# Patient Record
Sex: Male | Born: 1950 | Hispanic: No | Marital: Single
Health system: Southern US, Community
[De-identification: ages and names within clinical notes are randomized; demographics above are authoritative.]

---

## 2016-08-10 ENCOUNTER — Emergency Department (HOSPITAL_COMMUNITY): Payer: Self-pay

## 2016-08-10 ENCOUNTER — Encounter (HOSPITAL_COMMUNITY): Payer: Self-pay | Admitting: Emergency Medicine

## 2016-08-10 ENCOUNTER — Emergency Department (HOSPITAL_COMMUNITY)
Admission: EM | Admit: 2016-08-10 | Discharge: 2016-08-10 | Disposition: A | Discharge status: Home / Self Care | Payer: Self-pay | Attending: Emergency Medicine | Admitting: Emergency Medicine

## 2016-08-10 DIAGNOSIS — N132 Hydronephrosis with renal and ureteral calculous obstruction: Secondary | ICD-10-CM

## 2016-08-10 DIAGNOSIS — N1330 Unspecified hydronephrosis: Secondary | ICD-10-CM | POA: Insufficient documentation

## 2016-08-10 DIAGNOSIS — N201 Calculus of ureter: Secondary | ICD-10-CM | POA: Insufficient documentation

## 2016-08-10 LAB — COMPREHENSIVE METABOLIC PANEL
ALT: 17 U/L (ref 17–63)
ANION GAP: 10 (ref 5–15)
AST: 25 U/L (ref 15–41)
Albumin: 4.1 g/dL (ref 3.5–5.0)
Alkaline Phosphatase: 68 U/L (ref 38–126)
BUN: 6 mg/dL (ref 6–20)
CHLORIDE: 106 mmol/L (ref 101–111)
CO2: 22 mmol/L (ref 22–32)
CREATININE: 0.96 mg/dL (ref 0.61–1.24)
Calcium: 9.2 mg/dL (ref 8.9–10.3)
Glucose, Bld: 121 mg/dL — ABNORMAL HIGH (ref 65–99)
POTASSIUM: 4 mmol/L (ref 3.5–5.1)
SODIUM: 138 mmol/L (ref 135–145)
Total Bilirubin: 1.2 mg/dL (ref 0.3–1.2)
Total Protein: 6.4 g/dL — ABNORMAL LOW (ref 6.5–8.1)

## 2016-08-10 LAB — URINE MICROSCOPIC-ADD ON: Squamous Epithelial / LPF: NONE SEEN

## 2016-08-10 LAB — URINALYSIS, ROUTINE W REFLEX MICROSCOPIC
Bilirubin Urine: NEGATIVE
Glucose, UA: NEGATIVE mg/dL
Ketones, ur: NEGATIVE mg/dL
Leukocytes, UA: NEGATIVE
NITRITE: NEGATIVE
PROTEIN: NEGATIVE mg/dL
Specific Gravity, Urine: 1.015 (ref 1.005–1.030)
pH: 5 (ref 5.0–8.0)

## 2016-08-10 LAB — CBC WITH DIFFERENTIAL/PLATELET
Basophils Absolute: 0 10*3/uL (ref 0.0–0.1)
Basophils Relative: 0 %
EOS ABS: 0.2 10*3/uL (ref 0.0–0.7)
EOS PCT: 2 %
HCT: 44.5 % (ref 39.0–52.0)
Hemoglobin: 15.8 g/dL (ref 13.0–17.0)
LYMPHS ABS: 1.9 10*3/uL (ref 0.7–4.0)
LYMPHS PCT: 26 %
MCH: 31.2 pg (ref 26.0–34.0)
MCHC: 35.5 g/dL (ref 30.0–36.0)
MCV: 87.8 fL (ref 78.0–100.0)
MONO ABS: 0.4 10*3/uL (ref 0.1–1.0)
MONOS PCT: 5 %
Neutro Abs: 5 10*3/uL (ref 1.7–7.7)
Neutrophils Relative %: 67 %
PLATELETS: 145 10*3/uL — AB (ref 150–400)
RBC: 5.07 MIL/uL (ref 4.22–5.81)
RDW: 13 % (ref 11.5–15.5)
WBC: 7.4 10*3/uL (ref 4.0–10.5)

## 2016-08-10 LAB — I-STAT TROPONIN, ED: TROPONIN I, POC: 0 ng/mL (ref 0.00–0.08)

## 2016-08-10 LAB — LIPASE, BLOOD: LIPASE: 39 U/L (ref 11–51)

## 2016-08-10 MED ORDER — ONDANSETRON HCL 4 MG/2ML IJ SOLN
4.0000 mg | Freq: Once | INTRAMUSCULAR | Status: AC
  Administered 2016-08-10: 4 mg via INTRAVENOUS
  Filled 2016-08-10: qty 2

## 2016-08-10 MED ORDER — TAMSULOSIN HCL 0.4 MG PO CAPS: 0.4000 mg | ORAL_CAPSULE | Freq: Two times a day (BID) | ORAL | 0 refills | Status: AC

## 2016-08-10 MED ORDER — HYDROCODONE-ACETAMINOPHEN 5-325 MG PO TABS
1.0000 | ORAL_TABLET | Freq: Once | ORAL | Status: AC
  Administered 2016-08-10: 1 via ORAL
  Filled 2016-08-10: qty 1

## 2016-08-10 MED ORDER — ONDANSETRON HCL 4 MG PO TABS: 4.0000 mg | ORAL_TABLET | Freq: Three times a day (TID) | ORAL | 0 refills | Status: AC | PRN

## 2016-08-10 MED ORDER — KETOROLAC TROMETHAMINE 60 MG/2ML IM SOLN
30.0000 mg | Freq: Once | INTRAMUSCULAR | Status: AC
  Administered 2016-08-10: 30 mg via INTRAMUSCULAR
  Filled 2016-08-10: qty 2

## 2016-08-10 MED ORDER — TAMSULOSIN HCL 0.4 MG PO CAPS
0.8000 mg | ORAL_CAPSULE | Freq: Once | ORAL | Status: AC
  Administered 2016-08-10: 0.8 mg via ORAL
  Filled 2016-08-10: qty 2

## 2016-08-10 MED ORDER — HYDROCODONE-ACETAMINOPHEN 5-325 MG PO TABS: 1.0000 | ORAL_TABLET | Freq: Four times a day (QID) | ORAL | 0 refills | Status: AC | PRN

## 2016-08-10 NOTE — ED Triage Notes (Signed)
Pt speaks [place] does not speak any english and has lower L quadrant abdominal pain woke up with it this morning. Denies N/V/D. Pt feels like he cannot pee and it burns when he urinates.

## 2016-08-10 NOTE — ED Notes (Signed)
Family at the bedside.

## 2016-08-10 NOTE — ED Notes (Signed)
Pt does not speak any english called interpreter 4 times to communicate, all interpreters were not able to speak pt's language.

## 2016-08-10 NOTE — ED Notes (Signed)
[phone] SAYTKZSWFBhayabhai, Brother of patient.  On way to hospital but will not be here until later this evening.  Another brother already admitted on another floor.  Norton BlizzardBhayabhai does speak English and patient has agreed to let him receive information on his behalf. Nurse/Doctor can call this number if needed.

## 2016-08-10 NOTE — ED Triage Notes (Signed)
The pt was brought down from 3000 where he was sitting with his brother who is the pt.   He is having abd pain he speaks no english  rn from 3000 brought him down

## 2016-08-10 NOTE — ED Notes (Signed)
Bladder scanned 17ml

## 2016-08-10 NOTE — Discharge Instructions (Signed)
Contact a health care provider if: You have a fever or chills. Your urine smells bad or looks cloudy. You have pain or burning when you pass urine. Get help right away if: Your flank pain or groin pain suddenly worsens. You become confused or disoriented or you lose consciousness. 

## 2016-08-10 NOTE — ED Notes (Signed)
Speaking with the pt.  Via interrupter , Arthor CaptainAbigail Harris, PA and primary RN

## 2016-08-10 NOTE — ED Provider Notes (Signed)
[facility]-EMERGENCY DEPT Provider Note   CSN: [id number] Arrival date & time: 08/10/16  0535     History   Chief Complaint Chief Complaint  Patient presents with  . Abdominal Pain    HPI Daniel Mcfarland is a 65 y.o. male who presents the emergency department with chief complaint of abdominal pain. The patient speaks only [place]. His brother is a pulmonologist in [place] and gives history, but does not offer direct translation. The patient has no known sig. PMH and takes no medication. The patient is from [place] and is visiting his brother who is currently IP. Translation provided by Dr. Lynden OxfordPranav Patel.The patient has had about one month of intermittent burning with urination. It has become constant with urination over the past 3 days. This morning the patient was able to urinate just a small amount but states that he has severe urgency and is producing little urine . When attempting to urinate. Post void residual Bladder scan done in the emergency department shows 17 mL of urine. He has no significant past medical history. The patient does not take any medications. He has a history of prostate issues and had a [place]URP done in [place]. He was admitted to the hospital 5 years ago, but is otherwise in very good health. He states that he is very uncomfortable. He denies flank pain , fevers, chills or other systemic and symptoms.  HPI  History reviewed. No pertinent past medical history.  There are no active problems to display for this patient.   History reviewed. No pertinent surgical history.     Home Medications    Prior to Admission medications   Not on File    Family History No family history on file.  Social History Social History  Substance Use Topics  . Smoking status: Unknown If Ever Smoked  . Smokeless tobacco: Never Used  . Alcohol use Not on file     Allergies   Patient has no allergy information on record.   Review of Systems Review of Systems  Ten systems  reviewed and are negative for acute change, except as noted in the HPI.   Physical Exam Updated Vital Signs BP (!) 160/109 (BP Location: Right Arm)   Pulse 100   Temp 97.3 F (36.3 C) (Oral)   Resp 18   Ht 5\' 8"  (1.727 m)   Wt 63.5 kg   SpO2 100%   BMI 21.29 kg/m   Physical Exam  Constitutional: He appears well-developed and well-nourished. No distress.  HENT:  Head: Normocephalic and atraumatic.  Eyes: Conjunctivae are normal. No scleral icterus.  Neck: Normal range of motion. Neck supple.  Cardiovascular: Normal rate, regular rhythm and normal heart sounds.   Pulmonary/Chest: Effort normal and breath sounds normal. No respiratory distress.  Abdominal: Soft. There is tenderness (RLQ).  NO CVA tenderness Appears uncomfortable  Musculoskeletal: He exhibits no edema.  Neurological: He is alert.  Skin: Skin is warm and dry. He is not diaphoretic.  Psychiatric: His behavior is normal.  Nursing note and vitals reviewed.    ED Treatments / Results  Labs (all labs ordered are listed, but only abnormal results are displayed) Labs Reviewed  CBC WITH DIFFERENTIAL/PLATELET  COMPREHENSIVE METABOLIC PANEL  LIPASE, BLOOD  URINALYSIS, ROUTINE W REFLEX MICROSCOPIC (NOT AT [facility])  I-STAT TROPOININ, ED    EKG  EKG Interpretation None       Radiology No results found.  Procedures Procedures (including critical care time)  Medications Ordered in ED Medications - No data to  display   Initial Impression / Assessment and Plan / ED Course  I have reviewed the triage vital signs and the nursing notes.  Pertinent labs & imaging results that were available during my care of the patient were reviewed by me and considered in my medical decision making (see chart for details).  Clinical Course as of Aug 11 1545  Sat Aug 10, 2016  [id number] Patient w/ 5.703mm obstructing renal stone on the left.  CT Renal Stone Study [[facility]]    Clinical Course User Index [[facility]] Arthor CaptainAbigail Everet Flagg, PA-C    Pt has been diagnosed with a Kidney Stone via CT. serum creatine WNL, vitals sign stable and the pt does not have irratractable vomiting.  No signs of infection.Pt will be dc home with pain medications & has been advised to follow up with PCP.    Final Clinical Impressions(s) / ED Diagnoses   Final diagnoses:  Ureteral stone with hydronephrosis    New Prescriptions New Prescriptions   No medications on file     Arthor Captainbigail Kollyn Lingafelter, PA-C 08/11/16 1547    Glynn OctaveStephen Rancour, MD 08/11/16 2113

## 2018-01-11 IMAGING — CT CT RENAL STONE PROTOCOL
2 of 4 series · 11 of 46 positions shown, 12 images · non-contrast
Comparison: None.

CLINICAL DATA: Left lower abdominal pain.  Dysuria.

EXAM:
CT ABDOMEN AND PELVIS WITHOUT CONTRAST
TECHNIQUE: Multidetector CT imaging of the abdomen and pelvis was performed
following the standard protocol without IV contrast.

[Series 201: stone study, idose (2) · axial · 0.68mm/px · z∈[-304,+81]mm · 8 of 93 slices shown, 9 images]
[im 8/93  soft-tissue]
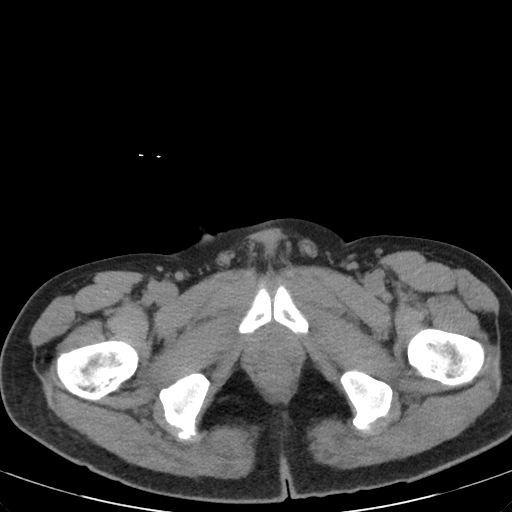
[im 8/93  bone]
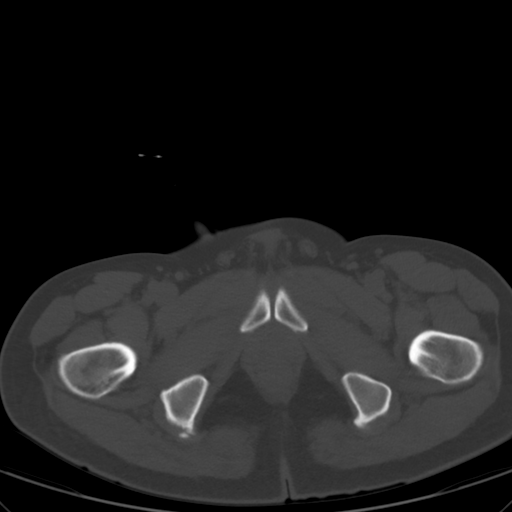
[im 18/93  soft-tissue]
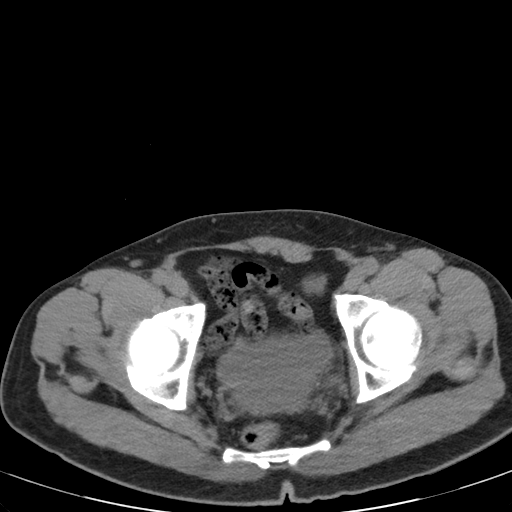
[im 29/93  soft-tissue]
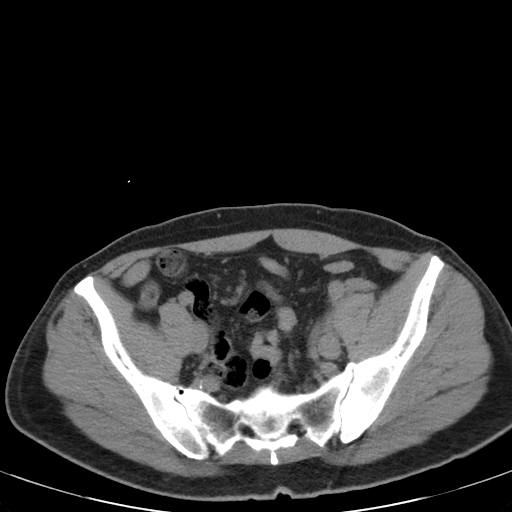
[im 39/93  soft-tissue]
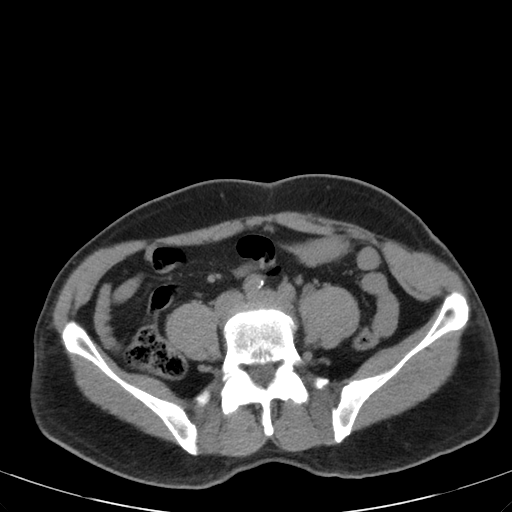
[im 54/93  soft-tissue]
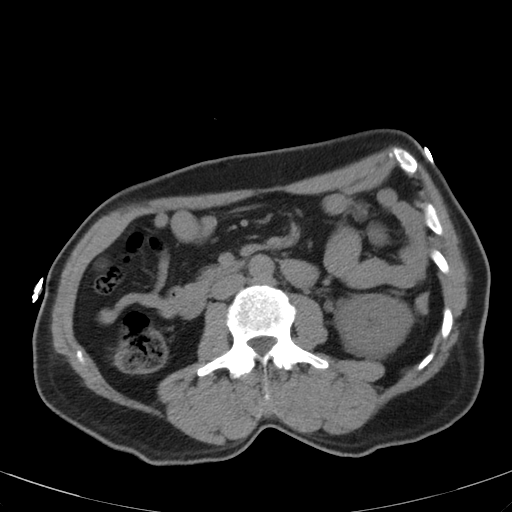
[im 64/93  soft-tissue]
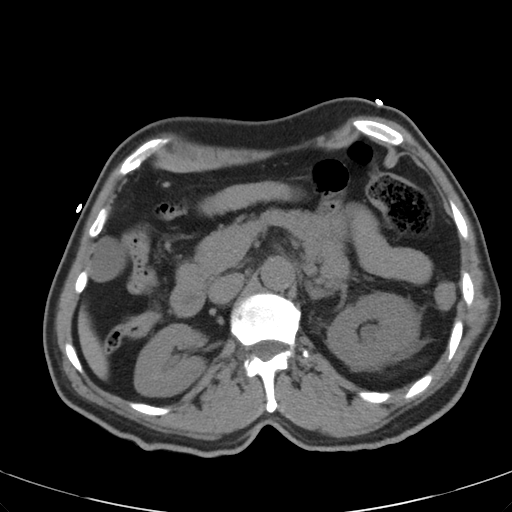
[im 75/93  soft-tissue]
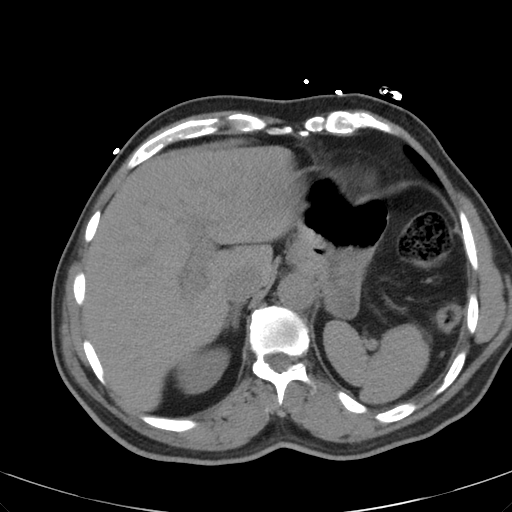
[im 85/93  soft-tissue]
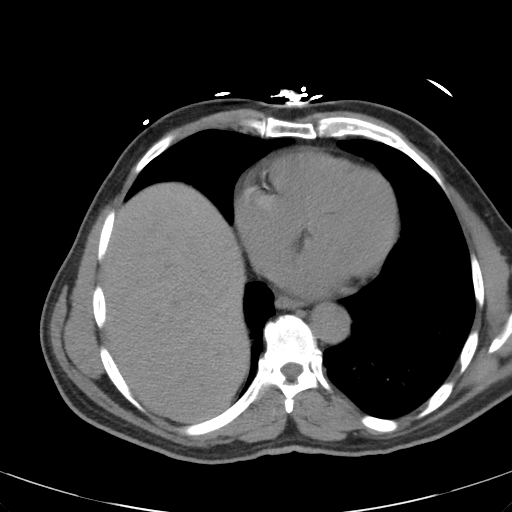

[Series 203: coronals, idose (2) · coronal · 0.45mm/px · 3 of 93 slices shown]
[im 31/93  soft-tissue]
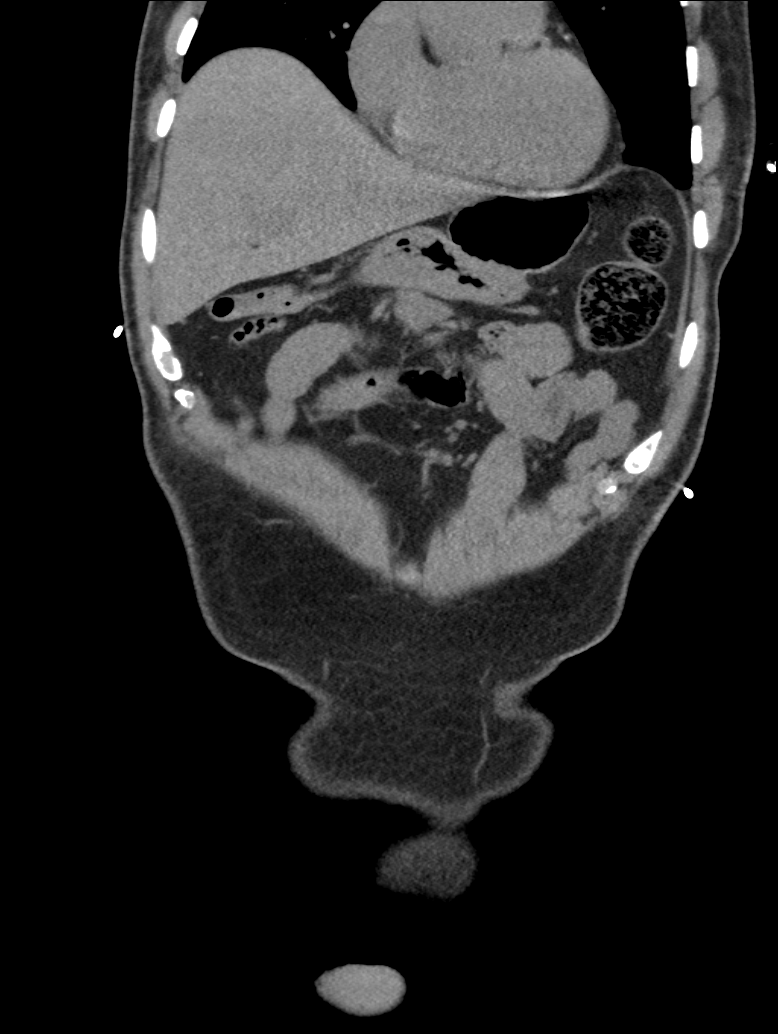
[im 41/93  soft-tissue]
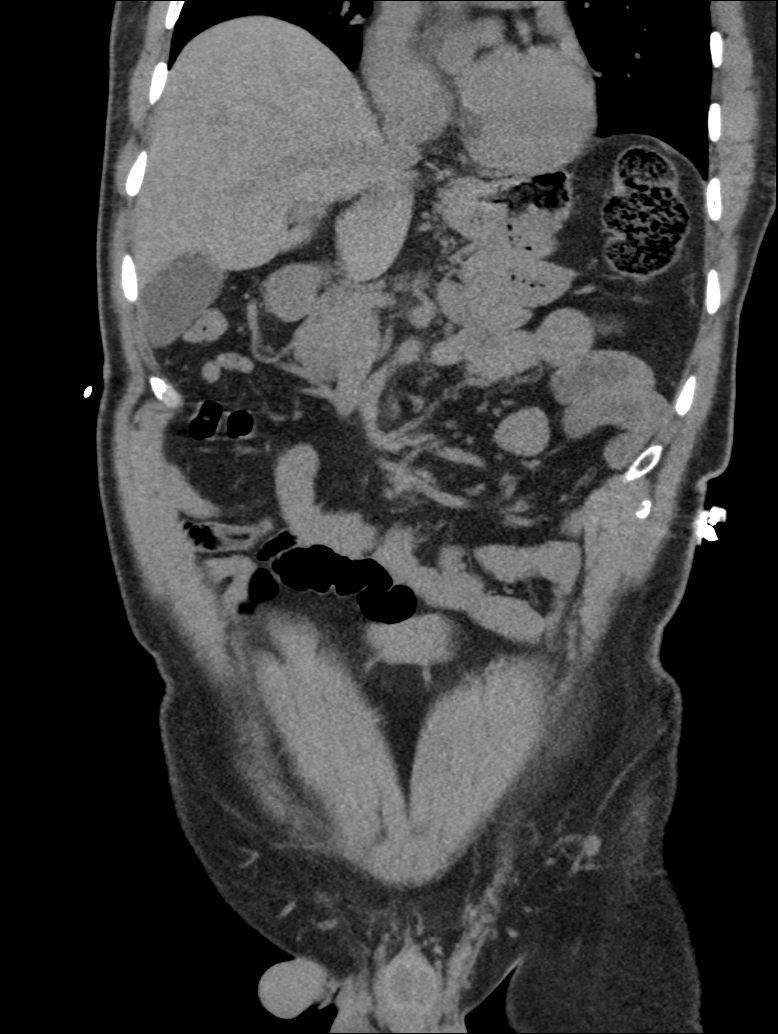
[im 52/93  soft-tissue]
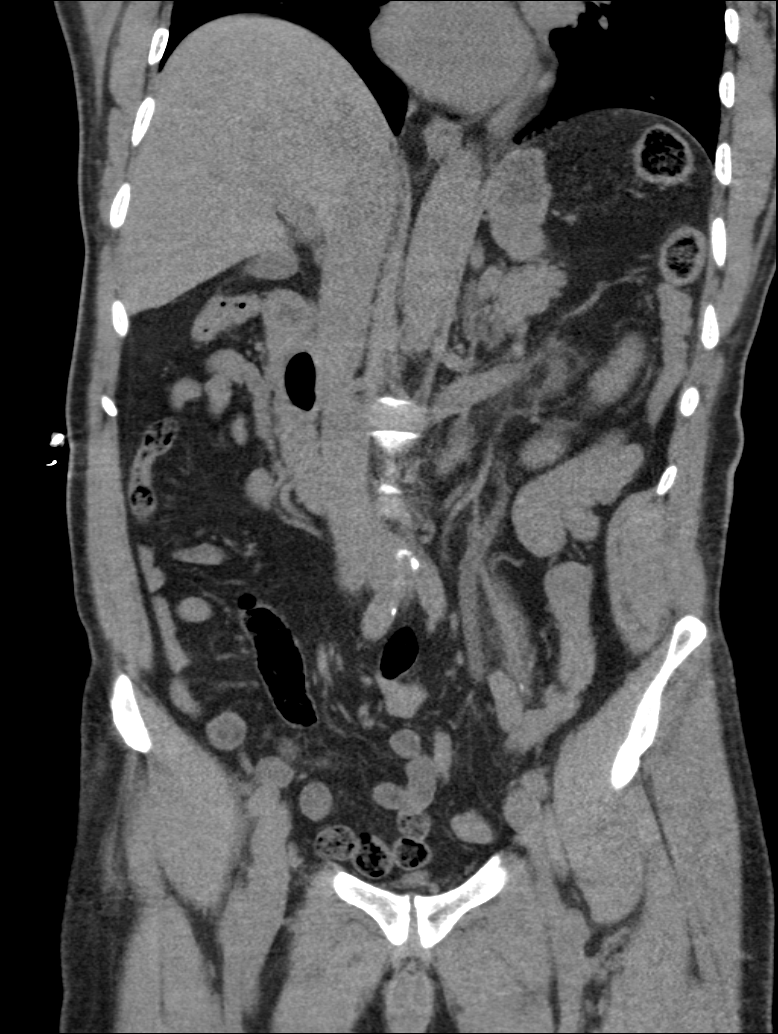

[11 of 46 positions shown; findings below may reference images not displayed]

FINDINGS: Lower chest: Coronary artery calcifications. No other abnormalities
in the lung bases.

Hepatobiliary: No focal liver abnormality is seen. No gallstones,
gallbladder wall thickening, or biliary dilatation.

Pancreas: Unremarkable. No pancreatic ductal dilatation or
surrounding inflammatory changes.

Spleen: Normal in size without focal abnormality.

Adrenals/Urinary Tract: There is a 5.3 mm stone in the distal left
ureter resulting in left ureterectasis, mild hydronephrosis, and
perinephric stranding. The adrenal glands and kidneys are otherwise
normal.

Stomach/Bowel: There is a duodenal diverticulum. The stomach and
small bowel otherwise demonstrate no significant abnormalities. The
colon and appendix are unremarkable.

Vascular/Lymphatic: Atherosclerotic change seen in the non
aneurysmal aorta and common iliac vessels. No adenopathy.

Reproductive: Prostate is unremarkable.

Other: No abdominal wall hernia or abnormality. No abdominopelvic
ascites.

Musculoskeletal: No acute or significant osseous findings.
IMPRESSION: 1. 5.3 mm stone in the distal left ureter resulting in
hydronephrosis, ureterectasis, and perinephric stranding as above.
2. Atherosclerotic change in the abdominal aorta.

## 2018-01-11 IMAGING — CR DG ABDOMEN ACUTE W/ 1V CHEST
3 series · 3 of 3 positions shown · non-contrast
Comparison: None.

CLINICAL DATA: Lower abdominal pain.  Disc urine.

EXAM:
DG ABDOMEN ACUTE W/ 1V CHEST

[chest pa]
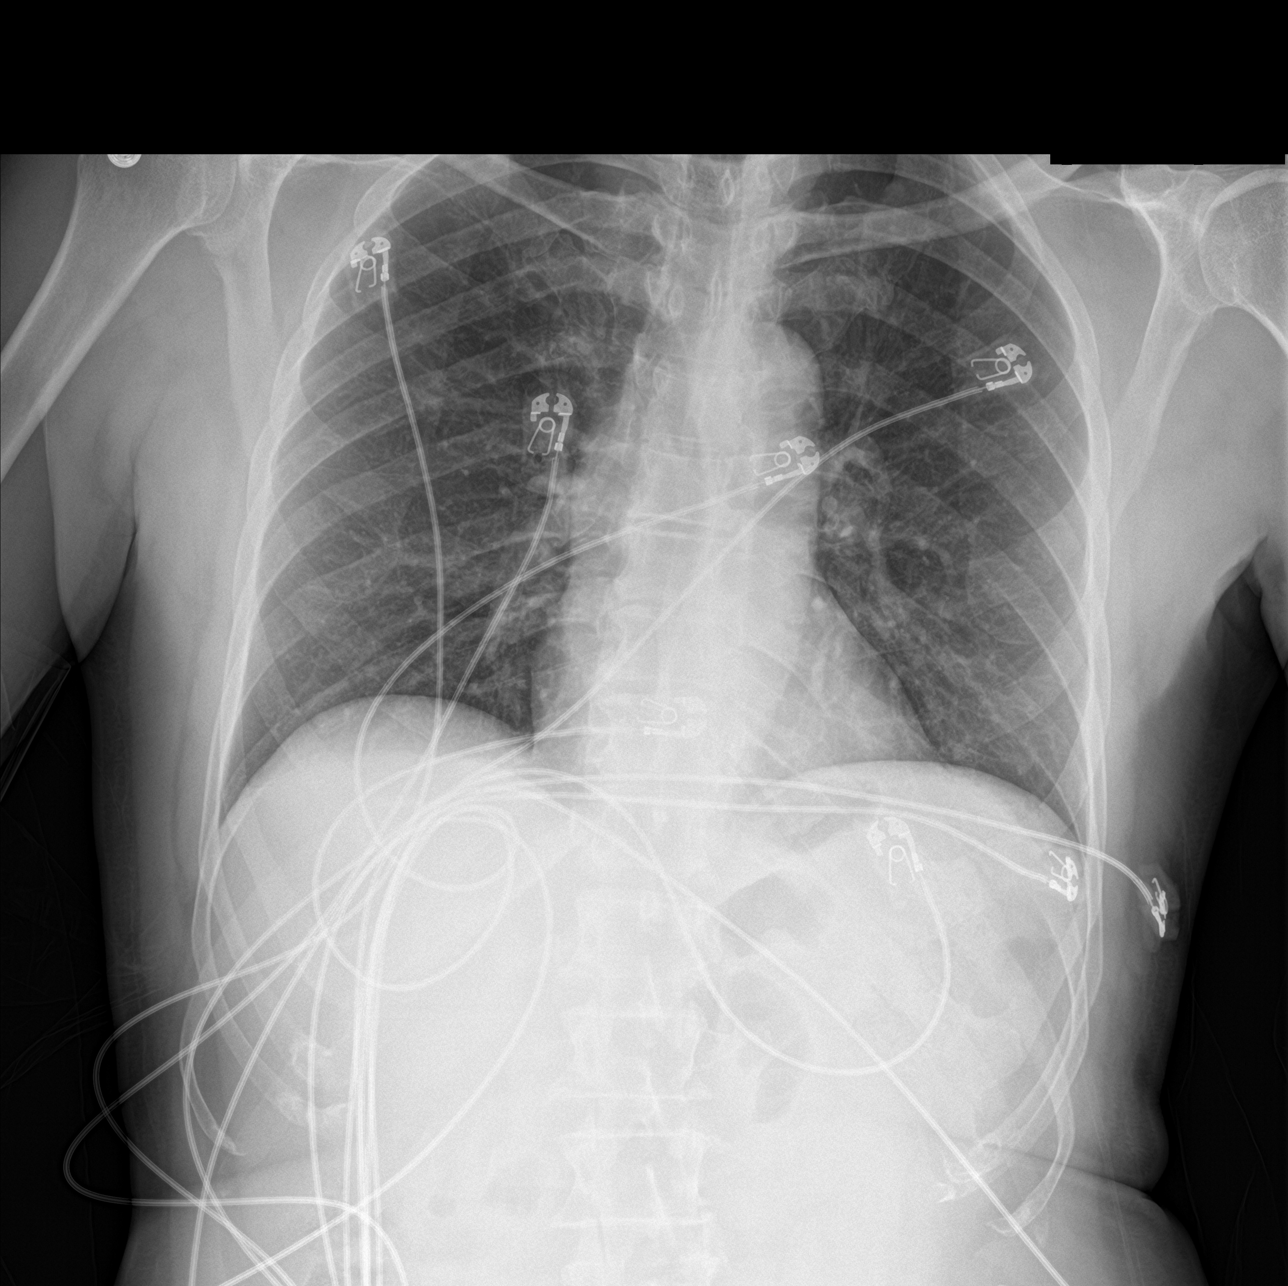

[abdomen erect]
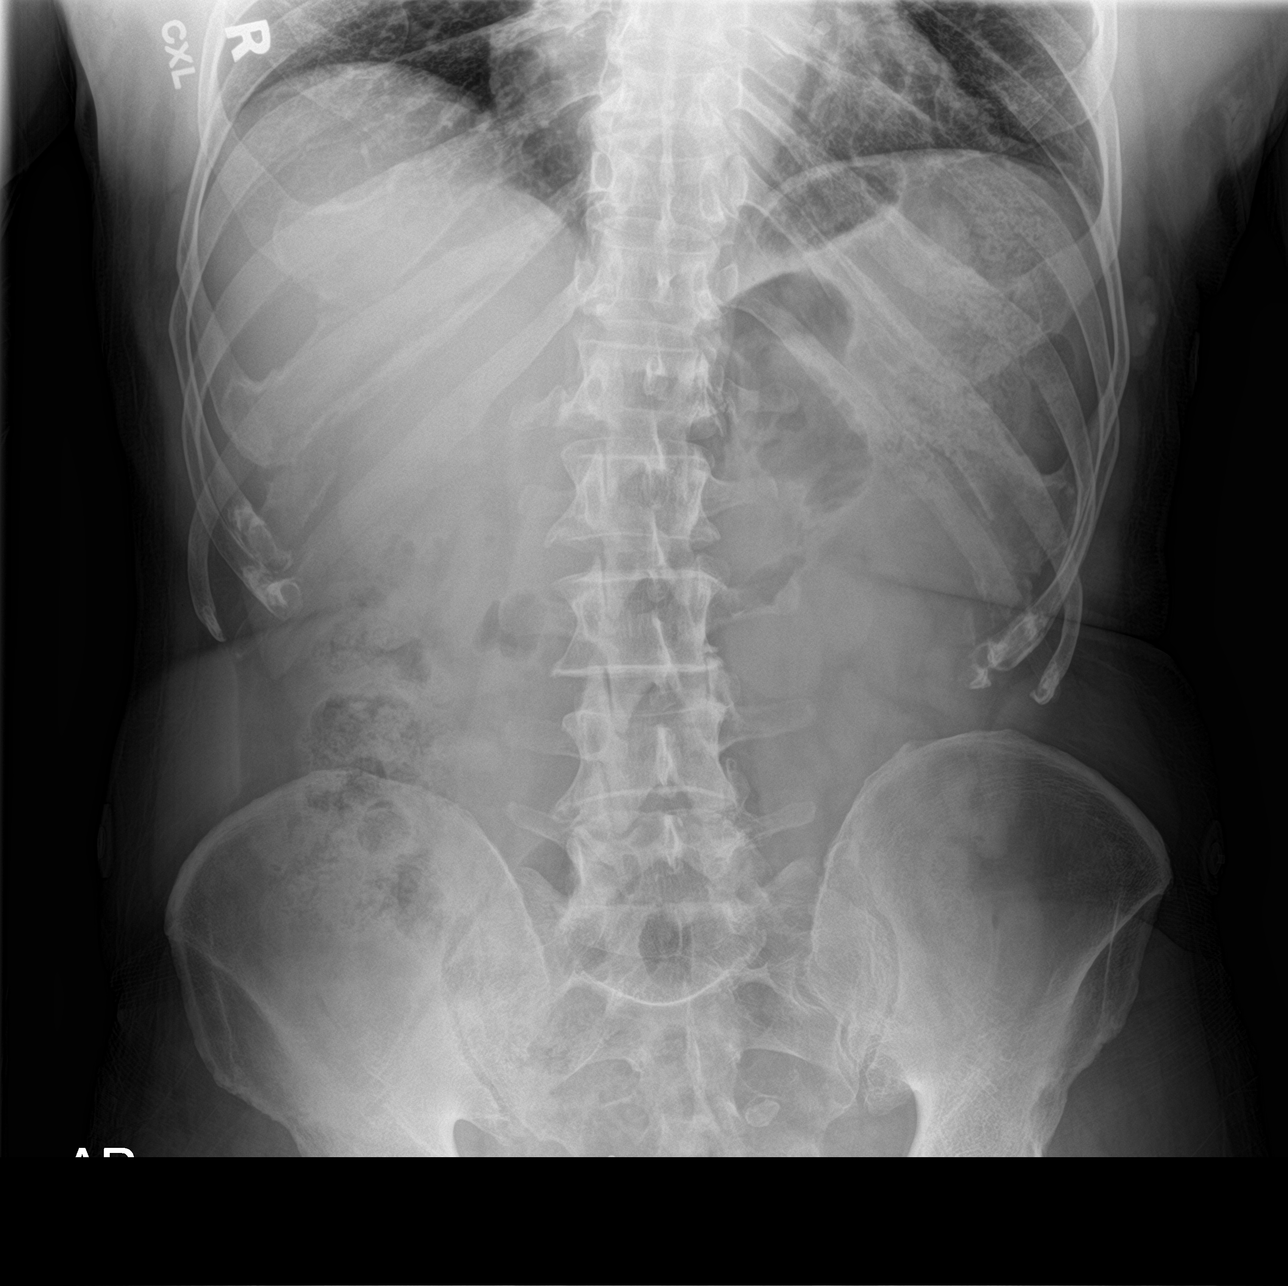

[abdomen supine]
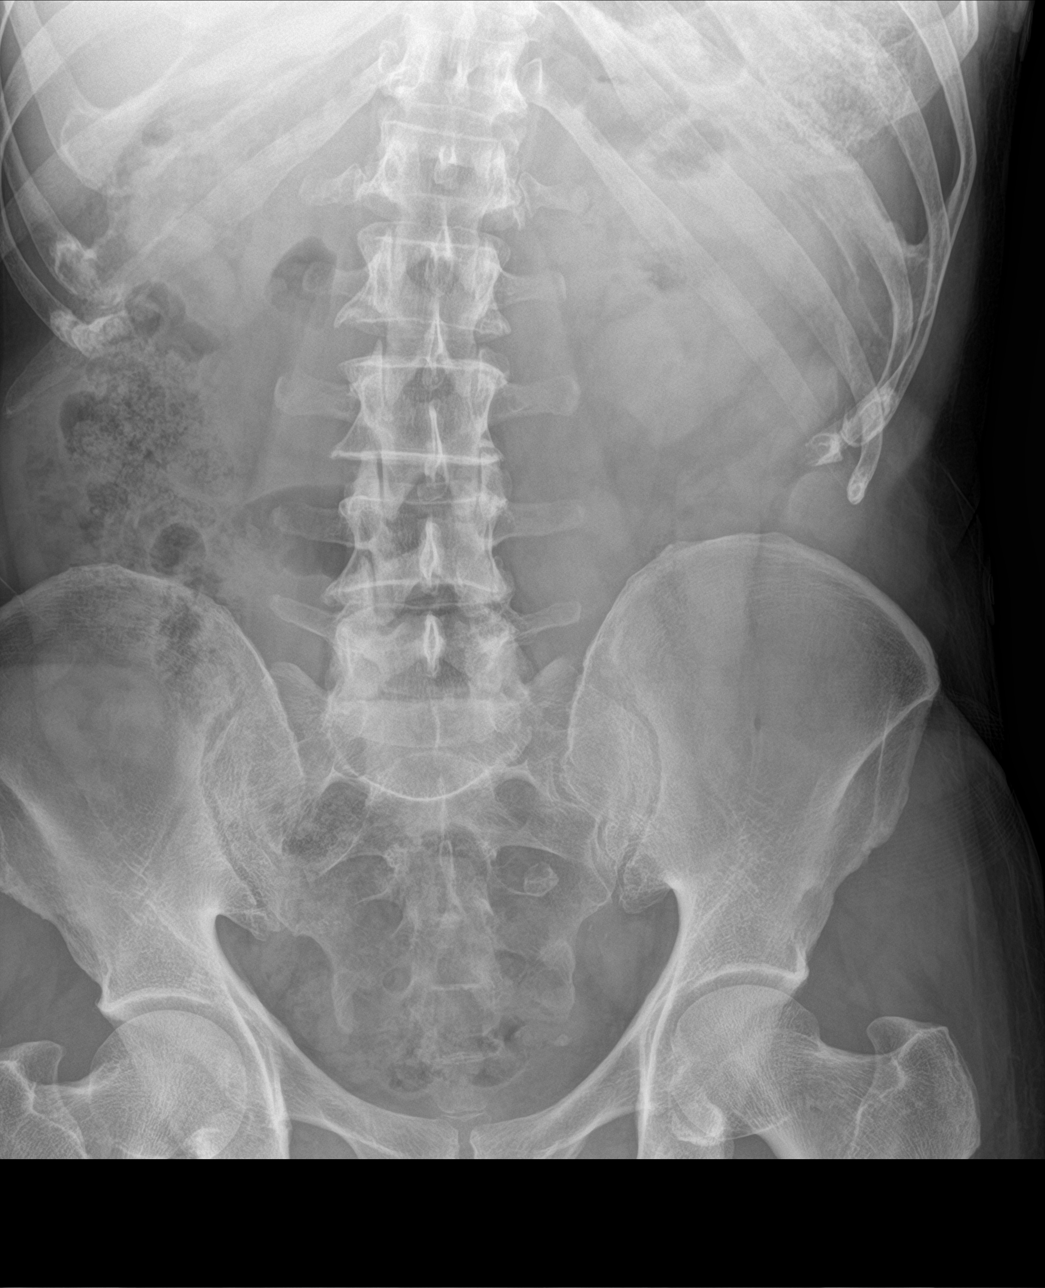

[3 of 3 positions shown; findings below may reference images not displayed]

FINDINGS: The heart size and mediastinal contours are normal. The lungs are
clear. There is no pleural effusion or pneumothorax. No acute
osseous findings are identified. Multiple telemetry leads overlie
the chest and upper abdomen.

The bowel gas pattern is normal. There is no free intraperitoneal
air. A phlebolith overlies the sacrum. There is a 4 mm calcification
in the inferior left pelvis, corresponding with a distal ureteral
calculus on CT performed today. Mild degenerative changes are
present within the lumbar spine.
IMPRESSION: 4 mm distal left ureteral calculus.  See separate CT report.
